# Patient Record
Sex: Male | Born: 2000 | Race: White | Hispanic: No | Marital: Single | Smoking: Never smoker
Health system: Southern US, Community
[De-identification: ages and names within clinical notes are randomized; demographics above are authoritative.]

---
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
## 2022-08-02 IMAGING — CT CT ABD-PELV W/ CM
2 of 5 series · 16 of 46 positions shown, 18 images · IV contrast (APPLIED)
Comparison: None.

CLINICAL DATA: Substernal chest pain, bradycardia, epigastric
abdominal pain

EXAM:
CT ANGIOGRAPHY CHEST
CT ABDOMEN AND PELVIS WITH CONTRAST
TECHNIQUE: Multidetector CT imaging of the chest was performed using the
standard protocol during bolus administration of intravenous
contrast. Multiplanar CT image reconstructions and MIPs were
obtained to evaluate the vascular anatomy. Multidetector CT imaging
of the abdomen and pelvis was performed using the standard protocol
during bolus administration of intravenous contrast.
CONTRAST:  100mL OMNIPAQUE IOHEXOL 350 MG/ML SOLN

[Series 7: thins · axial · 0.75mm/px · z∈[-586,-178]mm · 13 of 443 slices shown, 15 images]
[im 18/443  soft-tissue]
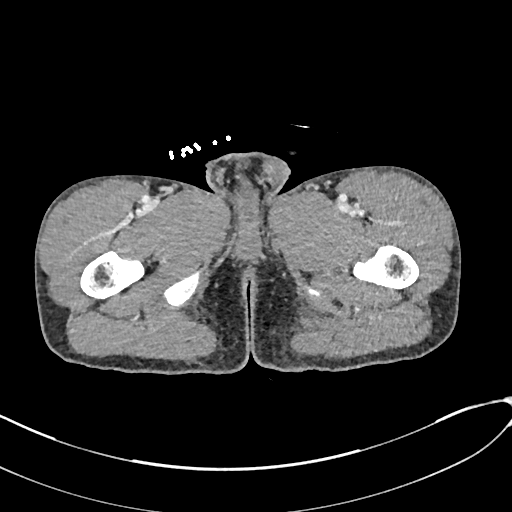
[im 18/443  bone]
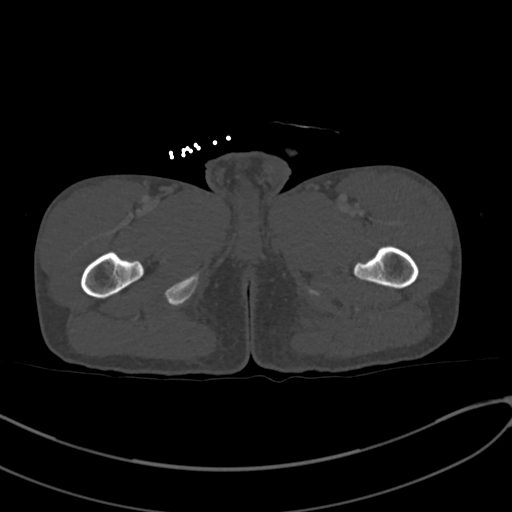
[im 54/443  soft-tissue]
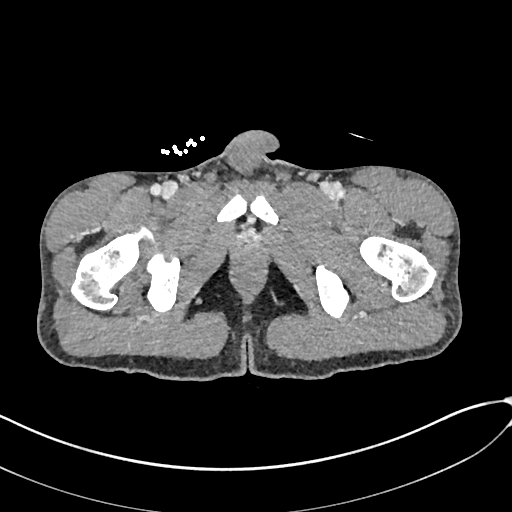
[im 89/443  soft-tissue]
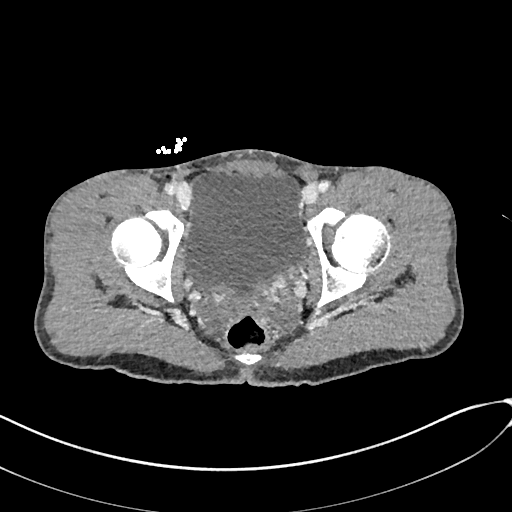
[im 124/443  soft-tissue]
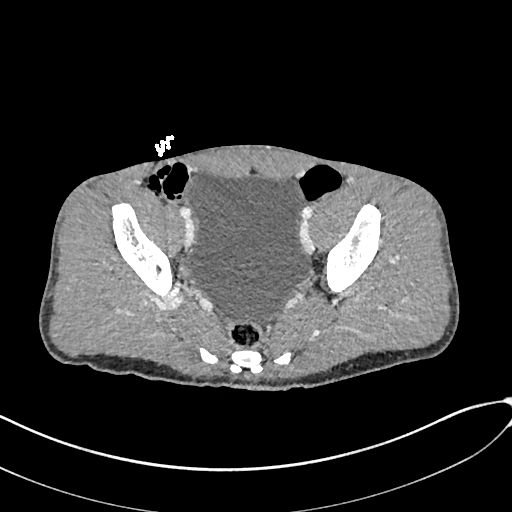
[im 160/443  soft-tissue]
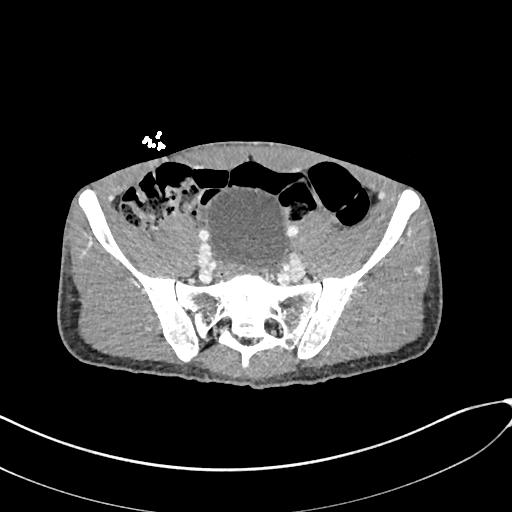
[im 195/443  soft-tissue]
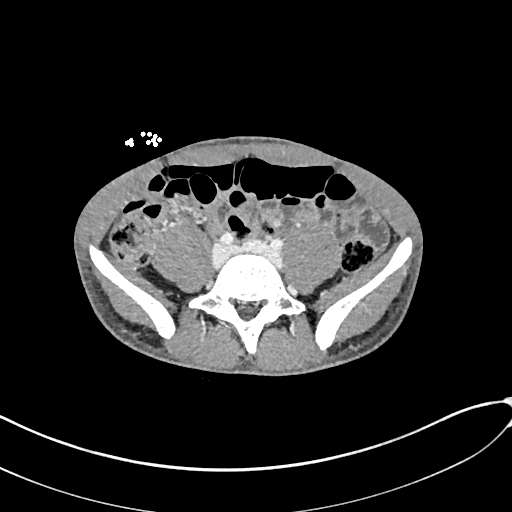
[im 230/443  soft-tissue]
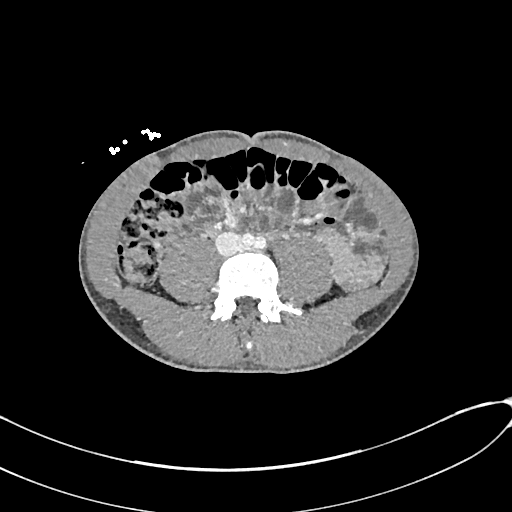
[im 248/443  soft-tissue]
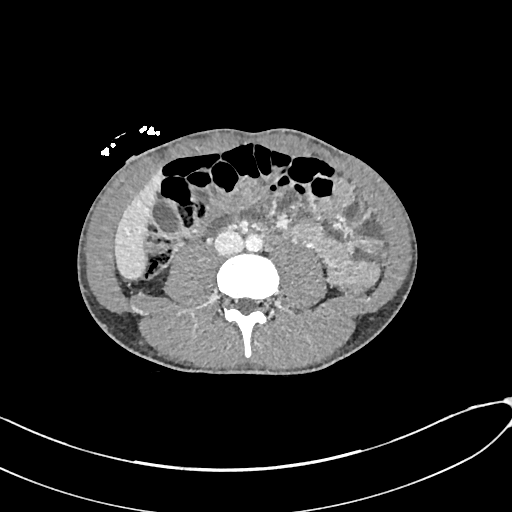
[im 283/443  soft-tissue]
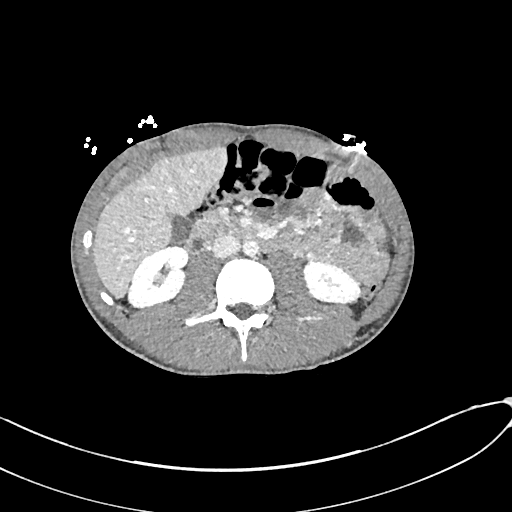
[im 283/443  bone]
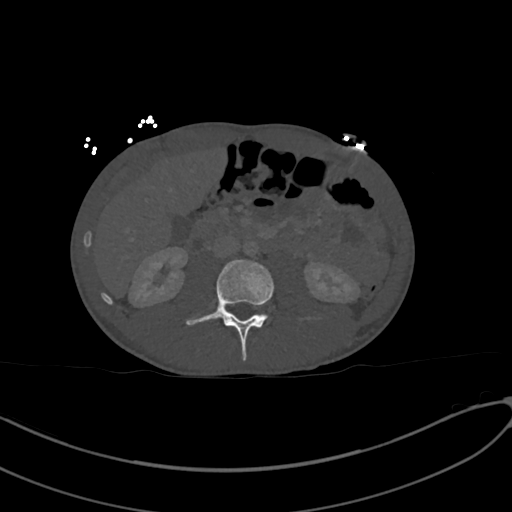
[im 319/443  soft-tissue]
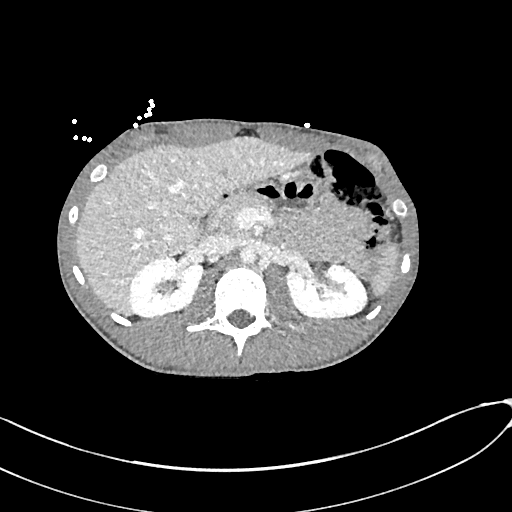
[im 354/443  soft-tissue]
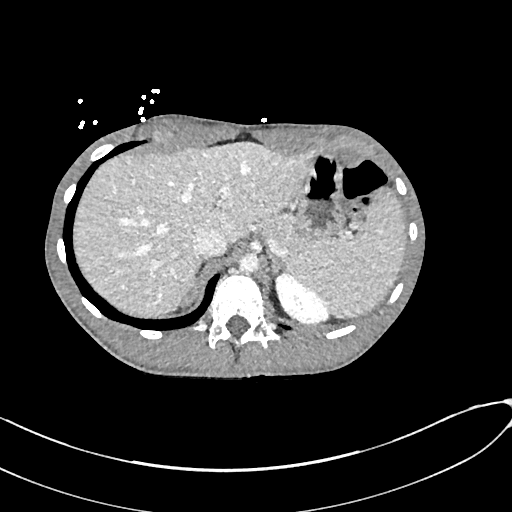
[im 389/443  soft-tissue]
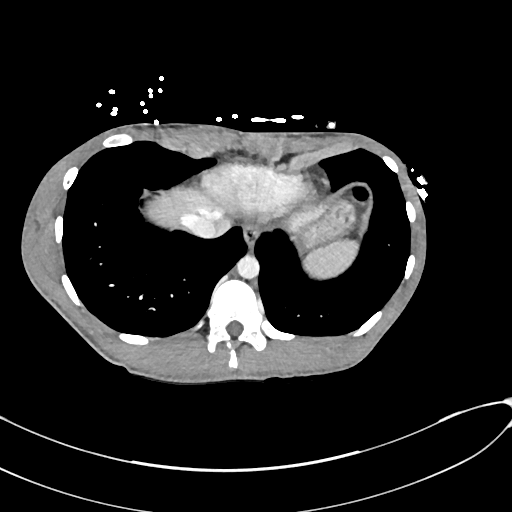
[im 425/443  soft-tissue]
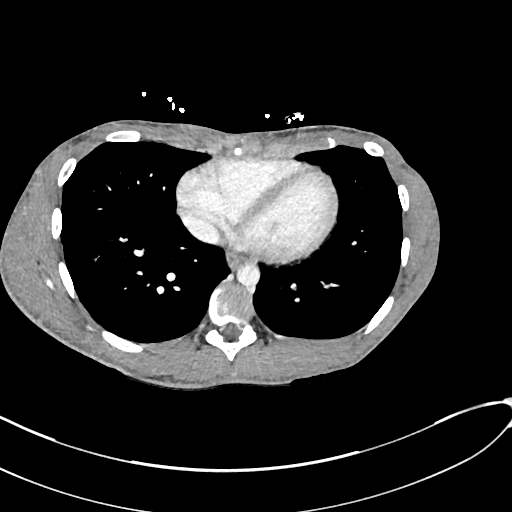

[Series 8: coronals · coronal · 0.86mm/px · 3 of 101 slices shown]
[im 34/101  soft-tissue]
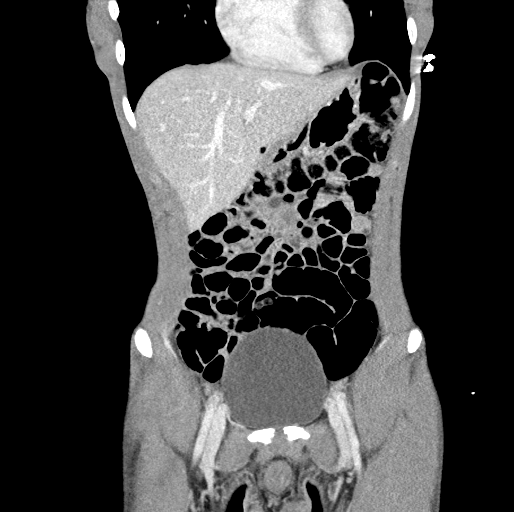
[im 45/101  soft-tissue]
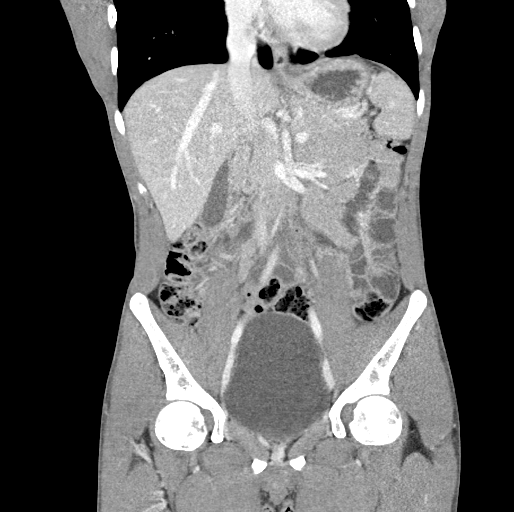
[im 56/101  soft-tissue]
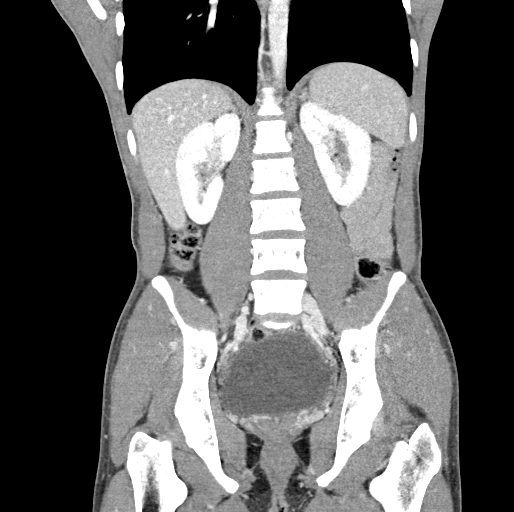

[16 of 46 positions shown; findings below may reference images not displayed]

FINDINGS: CTA CHEST FINDINGS

Cardiovascular: Satisfactory opacification of the pulmonary arteries
to the segmental level. No evidence of pulmonary embolism. Normal
heart size. No pericardial effusion. Aberrant right subclavian
artery. The abdominal aorta is of normal caliber.

Mediastinum/Nodes: No enlarged mediastinal, hilar, or axillary lymph
nodes. Thyroid gland, trachea, and esophagus demonstrate no
significant findings.

Lungs/Pleura: Lungs are clear. No pleural effusion or pneumothorax.
Central airways are widely patent.

Musculoskeletal: No chest wall abnormality. No acute or significant
osseous findings.

Review of the MIP images confirms the above findings.

CT ABDOMEN and PELVIS FINDINGS

Hepatobiliary: No focal liver abnormality is seen. No gallstones,
gallbladder wall thickening, or biliary dilatation.

Pancreas: Unremarkable

Spleen: Unremarkable

Adrenals/Urinary Tract: Adrenal glands are unremarkable. Kidneys are
normal, without renal calculi, focal lesion, or hydronephrosis.
Bladder is unremarkable.

Stomach/Bowel: Stomach is within normal limits. Appendix appears
normal. No evidence of bowel wall thickening, distention, or
inflammatory changes.

Vascular/Lymphatic: No significant vascular findings are present. No
enlarged abdominal or pelvic lymph nodes.

Reproductive: Prostate is unremarkable.

Other: No abdominal wall hernia or abnormality. No abdominopelvic
ascites.

Musculoskeletal: No acute or significant osseous findings.
IMPRESSION: No pulmonary embolism. No acute intrathoracic or intra-abdominal
pathology identified. Normal examination.
